# Patient Record
Sex: Female | Born: 1960 | Race: Black or African American | Hispanic: No | Marital: Married | Smoking: Never smoker
Health system: Southern US, Community
[De-identification: ages and names within clinical notes are randomized; demographics above are authoritative.]

## PROBLEM LIST (undated history)

## (undated) DIAGNOSIS — I10 Essential (primary) hypertension: Secondary | ICD-10-CM

---

## 1998-03-10 ENCOUNTER — Emergency Department (HOSPITAL_COMMUNITY): Admission: EM | Admit: 1998-03-10 | Discharge: 1998-03-10 | Payer: Self-pay

## 1998-05-05 ENCOUNTER — Emergency Department (HOSPITAL_COMMUNITY): Admission: EM | Admit: 1998-05-05 | Discharge: 1998-05-05 | Payer: Self-pay | Admitting: Internal Medicine

## 1998-05-12 ENCOUNTER — Emergency Department (HOSPITAL_COMMUNITY): Admission: EM | Admit: 1998-05-12 | Discharge: 1998-05-13 | Payer: Self-pay | Admitting: Emergency Medicine

## 1999-04-05 ENCOUNTER — Emergency Department (HOSPITAL_COMMUNITY): Admission: EM | Admit: 1999-04-05 | Discharge: 1999-04-06 | Payer: Self-pay | Admitting: *Deleted

## 2001-03-18 ENCOUNTER — Emergency Department (HOSPITAL_COMMUNITY): Admission: EM | Admit: 2001-03-18 | Discharge: 2001-03-19 | Payer: Self-pay | Admitting: Emergency Medicine

## 2008-02-27 ENCOUNTER — Emergency Department: Payer: Self-pay | Admitting: Emergency Medicine

## 2013-09-20 ENCOUNTER — Ambulatory Visit: Payer: Self-pay | Admitting: Obstetrics and Gynecology

## 2013-09-25 ENCOUNTER — Ambulatory Visit: Payer: Self-pay | Admitting: Otolaryngology

## 2013-10-08 ENCOUNTER — Ambulatory Visit: Payer: Self-pay | Admitting: Adult Health

## 2014-01-11 ENCOUNTER — Emergency Department: Payer: Self-pay | Admitting: Emergency Medicine

## 2014-01-11 LAB — CBC WITH DIFFERENTIAL/PLATELET
Basophil #: 0.1 10*3/uL (ref 0.0–0.1)
Basophil %: 0.8 %
Eosinophil #: 0 10*3/uL (ref 0.0–0.7)
Eosinophil %: 0.7 %
HCT: 39.8 % (ref 35.0–47.0)
HGB: 12.9 g/dL (ref 12.0–16.0)
Lymphocyte #: 2.2 10*3/uL (ref 1.0–3.6)
Lymphocyte %: 34.1 %
MCH: 29 pg (ref 26.0–34.0)
MCHC: 32.3 g/dL (ref 32.0–36.0)
MCV: 90 fL (ref 80–100)
Monocyte #: 0.5 x10 3/mm (ref 0.2–0.9)
Monocyte %: 7 %
Neutrophil #: 3.7 10*3/uL (ref 1.4–6.5)
Neutrophil %: 57.4 %
Platelet: 208 10*3/uL (ref 150–440)
RBC: 4.44 10*6/uL (ref 3.80–5.20)
RDW: 12.7 % (ref 11.5–14.5)
WBC: 6.5 10*3/uL (ref 3.6–11.0)

## 2014-01-11 LAB — COMPREHENSIVE METABOLIC PANEL
Albumin: 4.3 g/dL (ref 3.4–5.0)
Alkaline Phosphatase: 105 U/L
Anion Gap: 6 — ABNORMAL LOW (ref 7–16)
BUN: 11 mg/dL (ref 7–18)
Bilirubin,Total: 1 mg/dL (ref 0.2–1.0)
Calcium, Total: 9 mg/dL (ref 8.5–10.1)
Chloride: 102 mmol/L (ref 98–107)
Co2: 30 mmol/L (ref 21–32)
Creatinine: 0.74 mg/dL (ref 0.60–1.30)
EGFR (African American): >60
EGFR (Non-African Amer.): >60
Glucose: 116 mg/dL — ABNORMAL HIGH (ref 65–99)
Osmolality: 276 (ref 275–301)
Potassium: 3.2 mmol/L — ABNORMAL LOW (ref 3.5–5.1)
SGOT(AST): 39 U/L — ABNORMAL HIGH (ref 15–37)
SGPT (ALT): 21 U/L (ref 12–78)
Sodium: 138 mmol/L (ref 136–145)
Total Protein: 8.8 g/dL — ABNORMAL HIGH (ref 6.4–8.2)

## 2014-01-11 LAB — TROPONIN I: Troponin-I: <0.02 ng/mL

## 2015-04-16 ENCOUNTER — Ambulatory Visit: Payer: Medicaid Other | Attending: Oncology

## 2015-12-11 ENCOUNTER — Other Ambulatory Visit: Payer: Self-pay | Admitting: Physician Assistant

## 2015-12-11 DIAGNOSIS — Z1231 Encounter for screening mammogram for malignant neoplasm of breast: Secondary | ICD-10-CM

## 2015-12-29 ENCOUNTER — Ambulatory Visit
Admission: RE | Admit: 2015-12-29 | Discharge: 2015-12-29 | Disposition: A | Discharge status: Home / Self Care | Payer: Medicaid Other | Source: Ambulatory Visit | Attending: Oncology | Admitting: Oncology

## 2015-12-29 ENCOUNTER — Ambulatory Visit: Payer: Medicaid Other | Attending: Oncology

## 2015-12-29 ENCOUNTER — Ambulatory Visit: Payer: Medicaid Other

## 2015-12-29 VITALS — BP 117/70 | HR 80 | Temp 97.0°F | Resp 20 | Ht 64.17 in | Wt 124.8 lb

## 2015-12-29 DIAGNOSIS — Z0000 Encounter for general adult medical examination without abnormal findings: Secondary | ICD-10-CM

## 2015-12-29 NOTE — Progress Notes (Signed)
Subjective:     Patient ID: Kirsten Powell, female   DOB: September 24, 1960, 55 y.o.   MRN: [id number]  HPI   Review of Systems     Objective:   Physical Exam  Pulmonary/Chest: Right breast exhibits no inverted nipple, no mass, no nipple discharge, no skin change and no tenderness. Left breast exhibits no inverted nipple, no mass, no nipple discharge, no skin change and no tenderness. Breasts are symmetrical.       Assessment:     55 year old patient [facility]  Patient screened, and meets [facility] eligibility.  Patient does not have insurance, Medicare or Medicaid.  Handout given on Affordable Care Act.  Instructed patient on breast self-exam using teach back method,  CBE unremarkable.  No mass or lump palpated.  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     Plan:     Sent for bilateral screening mammogram.

## 2016-01-07 ENCOUNTER — Ambulatory Visit: Payer: Medicaid Other

## 2016-01-15 NOTE — Progress Notes (Signed)
Letter mailed from [facility] to notify of normal mammogram results.  Patient to return in one year for annual screening.  Copy to [facility]. 

## 2017-06-23 ENCOUNTER — Telehealth: Payer: Self-pay | Admitting: Pharmacy Technician

## 2017-06-23 NOTE — Telephone Encounter (Signed)
Patient failed to provide current poi.  No additional medication assistance will be provided by [facility] without the required proof of income documentation.  Patient notified by letter.  Nasier Thumm J. Inman Fettig Care Manager [facility] 

## 2017-09-07 ENCOUNTER — Telehealth: Payer: Self-pay | Admitting: Pharmacy Technician

## 2017-09-07 NOTE — Telephone Encounter (Signed)
Patient failed to provide current poi.  No additional medication assistance will be provided by [facility] without the required proof of income documentation.  Patient notified by letter.  Cayleb Jarnigan J. Maryrose Colvin Care Manager [facility] 

## 2018-06-25 ENCOUNTER — Emergency Department
Admission: EM | Admit: 2018-06-25 | Discharge: 2018-06-25 | Disposition: A | Discharge status: Home / Self Care | Payer: BLUE CROSS/BLUE SHIELD | Attending: Emergency Medicine | Admitting: Emergency Medicine

## 2018-06-25 ENCOUNTER — Other Ambulatory Visit: Payer: Self-pay

## 2018-06-25 ENCOUNTER — Emergency Department: Payer: BLUE CROSS/BLUE SHIELD

## 2018-06-25 DIAGNOSIS — S63502A Unspecified sprain of left wrist, initial encounter: Secondary | ICD-10-CM | POA: Diagnosis not present

## 2018-06-25 DIAGNOSIS — S6992XA Unspecified injury of left wrist, hand and finger(s), initial encounter: Secondary | ICD-10-CM | POA: Diagnosis present

## 2018-06-25 DIAGNOSIS — Y9389 Activity, other specified: Secondary | ICD-10-CM | POA: Diagnosis not present

## 2018-06-25 DIAGNOSIS — W11XXXA Fall on and from ladder, initial encounter: Secondary | ICD-10-CM | POA: Diagnosis not present

## 2018-06-25 DIAGNOSIS — I10 Essential (primary) hypertension: Secondary | ICD-10-CM | POA: Insufficient documentation

## 2018-06-25 DIAGNOSIS — Y998 Other external cause status: Secondary | ICD-10-CM | POA: Insufficient documentation

## 2018-06-25 DIAGNOSIS — Y9289 Other specified places as the place of occurrence of the external cause: Secondary | ICD-10-CM | POA: Diagnosis not present

## 2018-06-25 DIAGNOSIS — W19XXXA Unspecified fall, initial encounter: Secondary | ICD-10-CM

## 2018-06-25 HISTORY — DX: Essential (primary) hypertension: I10

## 2018-06-25 NOTE — ED Triage Notes (Signed)
Pt states she was putting some shoes up in the top of her closet and slipped and fell off the 2step ladder landing and injuring her left wrist and hand.

## 2018-06-25 NOTE — ED Notes (Signed)
Pt fell off a ladder  About 3 steps  Today  C/o pain l  Wrist  And hand  Pt  Has pain  Grip  Is  Weak l  Arm

## 2018-06-25 NOTE — ED Provider Notes (Signed)
[facility] Emergency Department Provider Note  ____________________________________________  Time seen: Approximately 7:00 PM  I have reviewed the triage vital signs and the nursing notes.   HISTORY  Chief Complaint Fall and Arm Injury    HPI Kirsten Powell is a 57 y.o. female presents to the emergency department with left hand and left wrist pain that started today after patient fell on an outstretched hand.  Patient reports that she was attempting to straighten boxes of shoes when she lost her balance.  No elbow pain or left shoulder pain.  Patient denies subjective weakness.  No numbness or tingling in the left upper extremity.  No neck pain.  Patient reports that she did not hit her head during fall.  No alleviating measures have been attempted.   Past Medical History:  Diagnosis Date  . Hypertension     There are no active problems to display for this patient.   History reviewed. No pertinent surgical history.  Prior to Admission medications   Not on File    Allergies Epinephrine  Family History  Problem Relation Age of Onset  . Breast cancer Neg Hx     Social History Social History   Tobacco Use  . Smoking status: Never Smoker  . Smokeless tobacco: Never Used  Substance Use Topics  . Alcohol use: Yes  . Drug use: Not Currently     Review of Systems  Constitutional: No fever/chills Eyes: No visual changes. No discharge ENT: No upper respiratory complaints. Cardiovascular: no chest pain. Respiratory: no cough. No SOB. Gastrointestinal: No abdominal pain.  No nausea, no vomiting.  No diarrhea.  No constipation. Genitourinary: Negative for dysuria. No hematuria Musculoskeletal: Patient has left wrist and left hand pain. Skin: Negative for rash, abrasions, lacerations, ecchymosis. Neurological: Negative for headaches, focal weakness or numbness.   ____________________________________________   PHYSICAL EXAM:  VITAL  SIGNS: ED Triage Vitals  Enc Vitals Group     BP 06/25/18 1711 130/83     Pulse Rate 06/25/18 1711 93     Resp 06/25/18 1711 14     Temp 06/25/18 1711 98.4 F (36.9 C)     Temp Source 06/25/18 1711 Oral     SpO2 06/25/18 1711 100 %     Weight 06/25/18 1711 125 lb (56.7 kg)     Height 06/25/18 1711 5\' 4"  (1.626 m)     Head Circumference --      Peak Flow --      Pain Score 06/25/18 1719 5     Pain Loc --      Pain Edu? --      Excl. in GC? --      Constitutional: Alert and oriented. Well appearing and in no acute distress. Eyes: Conjunctivae are normal. PERRL. EOMI. Head: Atraumatic. ENT:      Ears: TMs are pearly.      Nose: No congestion/rhinnorhea.      Mouth/Throat: Mucous membranes are moist.  Neck: No stridor.  No cervical spine tenderness to palpation. Cardiovascular: Normal rate, regular rhythm. Normal S1 and S2.  Good peripheral circulation. Respiratory: Normal respiratory effort without tachypnea or retractions. Lungs CTAB. Good air entry to the bases with no decreased or absent breath sounds. Gastrointestinal: Bowel sounds 4 quadrants. Soft and nontender to palpation. No guarding or rigidity. No palpable masses. No distention. No CVA tenderness. Musculoskeletal: Patient performs full range of motion at the left wrist and left elbow.  She is able to perform supination and pronation  without difficulty.  Mild tenderness is elicited with palpation of the anatomical snuffbox, left.  Palpable radial pulse bilaterally and symmetrically. Neurologic:  Normal speech and language. No gross focal neurologic deficits are appreciated.  Skin:  Skin is warm, dry and intact. No rash noted. Psychiatric: Mood and affect are normal. Speech and behavior are normal. Patient exhibits appropriate insight and judgement.   ____________________________________________   LABS (all labs ordered are listed, but only abnormal results are displayed)  Labs Reviewed - No data to  display ____________________________________________  EKG   ____________________________________________  RADIOLOGY I personally viewed and evaluated these images as part of my medical decision making, as well as reviewing the written report by the radiologist.  Dg Wrist Complete Left  Result Date: 06/25/2018 CLINICAL DATA:  Pain secondary to a fall from a ladder. EXAM: LEFT WRIST - COMPLETE 3+ VIEW COMPARISON:  None. FINDINGS: There is no evidence of fracture or dislocation. There is no evidence of arthropathy or other focal bone abnormality. Soft tissues are unremarkable. IMPRESSION: Negative. Electronically Signed   By: Francene Boyers M.D.   On: 06/25/2018 18:13   Dg Hand Complete Left  Result Date: 06/25/2018 CLINICAL DATA:  Pain secondary to a fall from a ladder. EXAM: LEFT HAND - COMPLETE 3+ VIEW COMPARISON:  None. FINDINGS: There is no evidence of fracture or dislocation. There is no evidence of significant arthropathy or other focal bone abnormality. Soft tissues are unremarkable. IMPRESSION: Negative. Electronically Signed   By: Francene Boyers M.D.   On: 06/25/2018 18:12    ____________________________________________    PROCEDURES  Procedure(s) performed:    Procedures    Medications - No data to display   ____________________________________________   INITIAL IMPRESSION / ASSESSMENT AND PLAN / ED COURSE  Pertinent labs & imaging results that were available during my care of the patient were reviewed by me and considered in my medical decision making (see chart for details).  Review of the [place] CSRS was performed in accordance of the [facility] prior to dispensing any controlled drugs.      Assessment and plan Left wrist sprain Patient presents to the emergency department with left wrist and left ear pain after falling on an outstretched left hand while trying to straighten shoes.  No acute bony abnormality was visualized on x-ray examination of the left wrist and  left hand.  Patient was placed in a Velcro wrist splint and Tylenol was recommended for pain as patient reports that her cardiologist recommends against anti-inflammatories.  Patient was advised to follow-up with orthopedics as needed.  All patient questions were answered.     ____________________________________________  FINAL CLINICAL IMPRESSION(S) / ED DIAGNOSES  Final diagnoses:  Fall, initial encounter      NEW MEDICATIONS STARTED DURING THIS VISIT:  ED Discharge Orders    None          This chart was dictated using voice recognition software/Dragon. Despite best efforts to proofread, errors can occur which can change the meaning. Any change was purely unintentional.    Orvil Feil, PA-C 06/25/18 2029    Jeanmarie Plant, MD 06/25/18 2111

## 2018-06-25 NOTE — ED Notes (Signed)
velcro cock up  Splint applied to jacob  l arm

## 2018-08-07 ENCOUNTER — Other Ambulatory Visit: Payer: Self-pay | Admitting: Family Medicine

## 2018-08-07 DIAGNOSIS — Z0000 Encounter for general adult medical examination without abnormal findings: Secondary | ICD-10-CM

## 2019-11-29 ENCOUNTER — Other Ambulatory Visit: Payer: Self-pay | Admitting: Family Medicine

## 2019-11-29 DIAGNOSIS — Z1231 Encounter for screening mammogram for malignant neoplasm of breast: Secondary | ICD-10-CM

## 2020-01-09 ENCOUNTER — Ambulatory Visit
Admission: RE | Admit: 2020-01-09 | Discharge: 2020-01-09 | Disposition: A | Discharge status: Home / Self Care | Payer: BC Managed Care – PPO | Source: Ambulatory Visit | Attending: Family Medicine | Admitting: Family Medicine

## 2020-01-09 DIAGNOSIS — Z1231 Encounter for screening mammogram for malignant neoplasm of breast: Secondary | ICD-10-CM | POA: Insufficient documentation

## 2020-02-07 ENCOUNTER — Other Ambulatory Visit: Payer: Self-pay | Admitting: Family Medicine

## 2020-02-07 ENCOUNTER — Ambulatory Visit
Admission: RE | Admit: 2020-02-07 | Discharge: 2020-02-07 | Disposition: A | Discharge status: Home / Self Care | Payer: BC Managed Care – PPO | Attending: Family Medicine | Admitting: Family Medicine

## 2020-02-07 ENCOUNTER — Ambulatory Visit
Admission: RE | Admit: 2020-02-07 | Discharge: 2020-02-07 | Disposition: A | Discharge status: Home / Self Care | Payer: BC Managed Care – PPO | Source: Ambulatory Visit | Attending: Family Medicine | Admitting: Family Medicine

## 2020-02-07 DIAGNOSIS — M545 Low back pain, unspecified: Secondary | ICD-10-CM

## 2020-02-07 DIAGNOSIS — M25551 Pain in right hip: Secondary | ICD-10-CM | POA: Diagnosis present

## 2020-02-09 ENCOUNTER — Ambulatory Visit: Payer: BC Managed Care – PPO

## 2020-06-07 ENCOUNTER — Emergency Department
Admission: EM | Admit: 2020-06-07 | Discharge: 2020-06-07 | Disposition: A | Discharge status: Home / Self Care | Payer: BC Managed Care – PPO | Attending: Emergency Medicine | Admitting: Emergency Medicine

## 2020-06-07 ENCOUNTER — Other Ambulatory Visit: Payer: Self-pay

## 2020-06-07 DIAGNOSIS — Z5321 Procedure and treatment not carried out due to patient leaving prior to being seen by health care provider: Secondary | ICD-10-CM | POA: Insufficient documentation

## 2020-06-07 DIAGNOSIS — I10 Essential (primary) hypertension: Secondary | ICD-10-CM | POA: Diagnosis not present

## 2020-06-07 LAB — COMPREHENSIVE METABOLIC PANEL
ALT: 17 U/L (ref 0–44)
AST: 27 U/L (ref 15–41)
Albumin: 4 g/dL (ref 3.5–5.0)
Alkaline Phosphatase: 67 U/L (ref 38–126)
Anion gap: 9 (ref 5–15)
BUN: 16 mg/dL (ref 6–20)
CO2: 29 mmol/L (ref 22–32)
Calcium: 9 mg/dL (ref 8.9–10.3)
Chloride: 103 mmol/L (ref 98–111)
Creatinine, Ser: 0.88 mg/dL (ref 0.44–1.00)
GFR calc Af Amer: >60 mL/min (ref >60)
GFR calc non Af Amer: >60 mL/min (ref >60)
Glucose, Bld: 118 mg/dL — ABNORMAL HIGH (ref 70–99)
Potassium: 3 mmol/L — ABNORMAL LOW (ref 3.5–5.1)
Sodium: 141 mmol/L (ref 135–145)
Total Bilirubin: 1.2 mg/dL (ref 0.3–1.2)
Total Protein: 7.6 g/dL (ref 6.5–8.1)

## 2020-06-07 LAB — CBC
HCT: 35.6 % — ABNORMAL LOW (ref 36.0–46.0)
Hemoglobin: 11.8 g/dL — ABNORMAL LOW (ref 12.0–15.0)
MCH: 29.7 pg (ref 26.0–34.0)
MCHC: 33.1 g/dL (ref 30.0–36.0)
MCV: 89.7 fL (ref 80.0–100.0)
Platelets: 211 10*3/uL (ref 150–400)
RBC: 3.97 MIL/uL (ref 3.87–5.11)
RDW: 11.8 % (ref 11.5–15.5)
WBC: 5.5 10*3/uL (ref 4.0–10.5)
nRBC: 0 % (ref 0.0–0.2)

## 2020-06-07 NOTE — ED Triage Notes (Signed)
Patient coming [facility] from home for hypertension (145/104).
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
## 2022-02-28 ENCOUNTER — Emergency Department
Admission: EM | Admit: 2022-02-28 | Discharge: 2022-02-28 | Disposition: A | Discharge status: Home / Self Care | Payer: BC Managed Care – PPO | Attending: Emergency Medicine | Admitting: Emergency Medicine

## 2022-02-28 ENCOUNTER — Emergency Department: Payer: BC Managed Care – PPO

## 2022-02-28 ENCOUNTER — Other Ambulatory Visit: Payer: Self-pay

## 2022-02-28 DIAGNOSIS — R0600 Dyspnea, unspecified: Secondary | ICD-10-CM | POA: Insufficient documentation

## 2022-02-28 DIAGNOSIS — E876 Hypokalemia: Secondary | ICD-10-CM | POA: Diagnosis not present

## 2022-02-28 DIAGNOSIS — F419 Anxiety disorder, unspecified: Secondary | ICD-10-CM | POA: Insufficient documentation

## 2022-02-28 DIAGNOSIS — R0789 Other chest pain: Secondary | ICD-10-CM | POA: Insufficient documentation

## 2022-02-28 DIAGNOSIS — R002 Palpitations: Secondary | ICD-10-CM | POA: Diagnosis present

## 2022-02-28 LAB — CBC WITH DIFFERENTIAL/PLATELET
Abs Immature Granulocytes: 0 10*3/uL (ref 0.00–0.07)
Basophils Absolute: 0 10*3/uL (ref 0.0–0.1)
Basophils Relative: 1 %
Eosinophils Absolute: 0.1 10*3/uL (ref 0.0–0.5)
Eosinophils Relative: 4 %
HCT: 37.4 % (ref 36.0–46.0)
Hemoglobin: 12.4 g/dL (ref 12.0–15.0)
Immature Granulocytes: 0 %
Lymphocytes Relative: 44 %
Lymphs Abs: 1.4 10*3/uL (ref 0.7–4.0)
MCH: 29.4 pg (ref 26.0–34.0)
MCHC: 33.2 g/dL (ref 30.0–36.0)
MCV: 88.6 fL (ref 80.0–100.0)
Monocytes Absolute: 0.4 10*3/uL (ref 0.1–1.0)
Monocytes Relative: 11 %
Neutro Abs: 1.3 10*3/uL — ABNORMAL LOW (ref 1.7–7.7)
Neutrophils Relative %: 40 %
Platelets: 218 10*3/uL (ref 150–400)
RBC: 4.22 MIL/uL (ref 3.87–5.11)
RDW: 11.6 % (ref 11.5–15.5)
WBC: 3.2 10*3/uL — ABNORMAL LOW (ref 4.0–10.5)
nRBC: 0 % (ref 0.0–0.2)

## 2022-02-28 LAB — BASIC METABOLIC PANEL
Anion gap: 10 (ref 5–15)
BUN: 14 mg/dL (ref 8–23)
CO2: 26 mmol/L (ref 22–32)
Calcium: 9.3 mg/dL (ref 8.9–10.3)
Chloride: 103 mmol/L (ref 98–111)
Creatinine, Ser: 1 mg/dL (ref 0.44–1.00)
GFR, Estimated: >60 mL/min (ref >60)
Glucose, Bld: 118 mg/dL — ABNORMAL HIGH (ref 70–99)
Potassium: 2.9 mmol/L — ABNORMAL LOW (ref 3.5–5.1)
Sodium: 139 mmol/L (ref 135–145)

## 2022-02-28 LAB — TROPONIN I (HIGH SENSITIVITY): Troponin I (High Sensitivity): 3 ng/L (ref <18)

## 2022-02-28 MED ORDER — POTASSIUM CHLORIDE 10 MEQ/100ML IV SOLN
10.0000 meq | Freq: Once | INTRAVENOUS | Status: AC
  Administered 2022-02-28: 10 meq via INTRAVENOUS
  Filled 2022-02-28: qty 100

## 2022-02-28 MED ORDER — POTASSIUM CHLORIDE CRYS ER 20 MEQ PO TBCR
40.0000 meq | EXTENDED_RELEASE_TABLET | Freq: Once | ORAL | Status: AC
  Administered 2022-02-28: 40 meq via ORAL
  Filled 2022-02-28: qty 2

## 2022-02-28 MED ORDER — SODIUM CHLORIDE 0.9 % IV SOLN: Freq: Once | INTRAVENOUS | Status: AC
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
## 2022-02-28 NOTE — ED Notes (Signed)
Sw resources given with dc instructions, pt states that they will be helpful states that she also has her own money and will get new lodging.
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
## 2022-02-28 NOTE — Discharge Instructions (Signed)
Please reach out to this resource for support.   [facility]: [phone]      At the [facility], people can come to one location to access a wide range of supportive resources, such as talking with a victim advocate, getting assistance with filing a restraining order, planning for their safety, talking to a Hydrographic surveyor, meeting with a professional to discuss civil and criminal legal issues, and gaining information on how to access shelter and other community resources.     AFTERHOURS  Immediate assistance is available 24 hours 7 days a week:     Family Abuse Services Crisis Line  [phone]     National Domestic Abuse Hotline  (985)535-3225         Shelters- Below are local DV shelters   [facility] [address] [place] [phone] DV Shelter     [facility] [place] [phone] DV Shelter     [facility]  [facility] [address] [place] [phone] DV Shelter

## 2022-03-01 ENCOUNTER — Emergency Department: Payer: BC Managed Care – PPO

## 2022-03-01 ENCOUNTER — Emergency Department
Admission: EM | Admit: 2022-03-01 | Discharge: 2022-03-01 | Disposition: A | Discharge status: Home / Self Care | Payer: BC Managed Care – PPO | Attending: Emergency Medicine | Admitting: Emergency Medicine

## 2022-03-01 ENCOUNTER — Other Ambulatory Visit: Payer: Self-pay

## 2022-03-01 ENCOUNTER — Ambulatory Visit: Payer: Self-pay | Admitting: *Deleted

## 2022-03-01 ENCOUNTER — Encounter: Payer: Self-pay | Admitting: Emergency Medicine

## 2022-03-01 DIAGNOSIS — R5383 Other fatigue: Secondary | ICD-10-CM | POA: Insufficient documentation

## 2022-03-01 DIAGNOSIS — R002 Palpitations: Secondary | ICD-10-CM | POA: Diagnosis not present

## 2022-03-01 DIAGNOSIS — R531 Weakness: Secondary | ICD-10-CM | POA: Diagnosis present

## 2022-03-01 DIAGNOSIS — R0602 Shortness of breath: Secondary | ICD-10-CM | POA: Insufficient documentation

## 2022-03-01 LAB — CBC
HCT: 35.7 % — ABNORMAL LOW (ref 36.0–46.0)
Hemoglobin: 11.5 g/dL — ABNORMAL LOW (ref 12.0–15.0)
MCH: 28.8 pg (ref 26.0–34.0)
MCHC: 32.2 g/dL (ref 30.0–36.0)
MCV: 89.5 fL (ref 80.0–100.0)
Platelets: 210 10*3/uL (ref 150–400)
RBC: 3.99 MIL/uL (ref 3.87–5.11)
RDW: 11.7 % (ref 11.5–15.5)
WBC: 3.2 10*3/uL — ABNORMAL LOW (ref 4.0–10.5)
nRBC: 0 % (ref 0.0–0.2)

## 2022-03-01 LAB — COMPREHENSIVE METABOLIC PANEL
ALT: 14 U/L (ref 0–44)
AST: 27 U/L (ref 15–41)
Albumin: 3.9 g/dL (ref 3.5–5.0)
Alkaline Phosphatase: 65 U/L (ref 38–126)
Anion gap: 6 (ref 5–15)
BUN: 15 mg/dL (ref 8–23)
CO2: 27 mmol/L (ref 22–32)
Calcium: 9 mg/dL (ref 8.9–10.3)
Chloride: 104 mmol/L (ref 98–111)
Creatinine, Ser: 0.8 mg/dL (ref 0.44–1.00)
GFR, Estimated: >60 mL/min (ref >60)
Glucose, Bld: 102 mg/dL — ABNORMAL HIGH (ref 70–99)
Potassium: 3.5 mmol/L (ref 3.5–5.1)
Sodium: 137 mmol/L (ref 135–145)
Total Bilirubin: 2 mg/dL — ABNORMAL HIGH (ref 0.3–1.2)
Total Protein: 7.4 g/dL (ref 6.5–8.1)

## 2022-03-01 LAB — URINALYSIS, ROUTINE W REFLEX MICROSCOPIC
Bilirubin Urine: NEGATIVE
Glucose, UA: NEGATIVE mg/dL
Hgb urine dipstick: NEGATIVE
Ketones, ur: NEGATIVE mg/dL
Leukocytes,Ua: NEGATIVE
Nitrite: NEGATIVE
Protein, ur: NEGATIVE mg/dL
Specific Gravity, Urine: 1.009 (ref 1.005–1.030)
pH: 6 (ref 5.0–8.0)

## 2022-03-01 LAB — TROPONIN I (HIGH SENSITIVITY): Troponin I (High Sensitivity): 4 ng/L (ref <18)

## 2022-03-01 MED ORDER — IOHEXOL 350 MG/ML SOLN
75.0000 mL | Freq: Once | INTRAVENOUS | Status: AC | PRN
  Administered 2022-03-01: 75 mL via INTRAVENOUS

## 2022-03-01 NOTE — ED Triage Notes (Signed)
Pt here for generalized weakness and shob for over a month. Was seen here yesterday for the same and dx with hypokalemia. Pt here for persistent symptoms.

## 2022-03-01 NOTE — ED Provider Notes (Signed)
[facility] Provider Note    Event Date/Time   First MD Initiated Contact with Patient 03/01/22 1111     (approximate)   History   Weakness   HPI  Jamilex Bohnsack is a 61 y.o. female with no significant past medical history presents with complaints of generalized weakness.  She reports she feels winded with exertion which is atypical for her.  She was seen yesterday and had a reassuring work-up but reprinted today for further evaluation.  No chest pain reported.  No pleurisy.  No history of PE.  No fevers chills.     Physical Exam   Triage Vital Signs: ED Triage Vitals  Enc Vitals Group     BP 03/01/22 1019 (!) 130/93     Pulse Rate 03/01/22 1019 (!) 118     Resp 03/01/22 1019 20     Temp 03/01/22 1019 98.8 F (37.1 C)     Temp Source 03/01/22 1019 Oral     SpO2 03/01/22 1019 100 %     Weight 03/01/22 1020 52 kg (114 lb 10.2 oz)     Height 03/01/22 1020 1.626 m (5\' 4" )     Head Circumference --      Peak Flow --      Pain Score 03/01/22 1019 0     Pain Loc --      Pain Edu? --      Excl. in GC? --     Most recent vital signs: Vitals:   03/01/22 1019  BP: (!) 130/93  Pulse: (!) 118  Resp: 20  Temp: 98.8 F (37.1 C)  SpO2: 100%     General: Awake, no distress.  CV:  Good peripheral perfusion.  Tachycardia Resp:  Normal effort.  CTA bilaterally Abd:  No distention.  Other:     ED Results / Procedures / Treatments   Labs (all labs ordered are listed, but only abnormal results are displayed) Labs Reviewed  CBC - Abnormal; Notable for the following components:      Result Value   WBC 3.2 (*)    Hemoglobin 11.5 (*)    HCT 35.7 (*)    All other components within normal limits  COMPREHENSIVE METABOLIC PANEL - Abnormal; Notable for the following components:   Glucose, Bld 102 (*)    Total Bilirubin 2.0 (*)    All other components within normal limits  URINALYSIS, ROUTINE W REFLEX MICROSCOPIC - Abnormal; Notable for the  following components:   Color, Urine YELLOW (*)    APPearance CLEAR (*)    All other components within normal limits  TROPONIN I (HIGH SENSITIVITY)     EKG  ED ECG REPORT I, 03/03/22, the attending physician, personally viewed and interpreted this ECG.  Date: 03/01/2022  Rhythm: normal sinus rhythm QRS Axis: normal Intervals: normal ST/T Wave abnormalities: normal Narrative Interpretation: no evidence of acute ischemia    RADIOLOGY CT angiography viewed by me, no evidence of PE    PROCEDURES:  Critical Care performed:   Procedures   MEDICATIONS ORDERED IN ED: Medications  iohexol (OMNIPAQUE) 350 MG/ML injection 75 mL (75 mLs Intravenous Contrast Given 03/01/22 1215)     IMPRESSION / MDM / ASSESSMENT AND PLAN / ED COURSE  I reviewed the triage vital signs and the nursing notes. Patient's presentation is most consistent with acute presentation with potential threat to life or bodily function.   Patient presents with fatigue, shortness of breath and occasional palpitations as detailed above  Differential includes pneumonia, pulmonary edema, PE  EKG is reassuring, high sensitive troponin is normal  CMP CBC is unremarkable  Sent for CT angiography, negative for PE pneumonia or pulmonary edema  Patient is significantly reassured by this.  Considered admission for further evaluation however she feels comfortable with discharge and outpatient follow-up as needed       FINAL CLINICAL IMPRESSION(S) / ED DIAGNOSES   Final diagnoses:  Weakness     Rx / DC Orders   ED Discharge Orders     None        Note:  This document was prepared using Dragon voice recognition software and may include unintentional dictation errors.   Jene Every, MD 03/01/22 1549

## 2022-03-01 NOTE — Telephone Encounter (Signed)
  Chief Complaint: dizziness Symptoms: feels weak and dizzy. Requesting to get a work note and receive more IVF. Reports she was cut off short yesterday in ED when receiving potassium Frequency: today  Pertinent Negatives: Patient denies chest pain difficulty breahting Disposition: [x] ED /[] Urgent Care (no appt availability in office) / [] Appointment(In office/virtual)/ []  [facility]/ [] Home Care/ [] Refused Recommended Disposition /[] [facility]/ []  Follow-up with PCP Additional Notes:   Recommended patient not to drive if dizzy and weak and call 911. Patient reports over and over she did not receive full dose of IVF yesterday and is still weak. PCP office closed. Going back to ED.     Reason for Disposition  [1] Dizziness caused by heat exposure, sudden standing, or poor fluid intake AND [2] no improvement after 2 hours of rest and fluids  Answer Assessment - Initial Assessment Questions 1. DESCRIPTION: "Describe your dizziness."     Feels weak  2. LIGHTHEADED: "Do you feel lightheaded?" (e.g., somewhat faint, woozy, weak upon standing)     Feels weak  3. VERTIGO: "Do you feel like either you or the room is spinning or tilting?" (i.e. vertigo)     No  4. SEVERITY: "How bad is it?"  "Do you feel like you are going to faint?" "Can you stand and walk?"   - MILD: Feels slightly dizzy, but walking normally.   - MODERATE: Feels unsteady when walking, but not falling; interferes with normal activities (e.g., school, work).   - SEVERE: Unable to walk without falling, or requires assistance to walk without falling; feels like passing out now.      Not bed now  5. ONSET:  "When did the dizziness begin?"     Na  6. AGGRAVATING FACTORS: "Does anything make it worse?" (e.g., standing, change in head position)     Na  7. HEART RATE: "Can you tell me your heart rate?" "How many beats in 15 seconds?"  (Note: not all patients can do this)       na 8. CAUSE: "What do you think  is causing the dizziness?"     She states she did not receive her full dose of IVF yesterday in ED 9. RECURRENT SYMPTOM: "Have you had dizziness before?" If Yes, ask: "When was the last time?" "What happened that time?"     Yes seen in ED yesterday  10. OTHER SYMPTOMS: "Do you have any other symptoms?" (e.g., fever, chest pain, vomiting, diarrhea, bleeding)       Dizziness  11. PREGNANCY: "Is there any chance you are pregnant?" "When was your last menstrual period?"       na  Protocols used: Dizziness - Lightheadedness-A-AH

## 2022-03-15 NOTE — Progress Notes (Deleted)
Cardiology Office Note:    Date:  03/15/2022  NAME:  Kirsten Powell    MRN: [id number] DOB:  13-Dec-1960   PCP:  Emogene Morgan, MD  Cardiologist:  None  Electrophysiologist:  None   Referring MD: Emogene Morgan, MD   No chief complaint on file. ***  History of Present Illness:    Kirsten Powell is a 61 y.o. female with a hx of HTN who is being seen today for the evaluation of SOB at the request of Emogene Morgan, MD.  ER Evaluation for weakness 03/01/2022. Minimal coronary calcifications noted.   Past Medical History: Past Medical History:  Diagnosis Date   Hypertension     Past Surgical History: No past surgical history on file.  Current Medications: No outpatient medications have been marked as taking for the 03/17/22 encounter (Appointment) with O'Neal, Ronnald Ramp, MD.     Allergies:    Epinephrine   Social History: Social History   Socioeconomic History   Marital status: Married    Spouse name: Not on file   Number of children: Not on file   Years of education: Not on file   Highest education level: Not on file  Occupational History   Not on file  Tobacco Use   Smoking status: Never   Smokeless tobacco: Never  Substance and Sexual Activity   Alcohol use: Yes   Drug use: Not Currently   Sexual activity: Not on file  Other Topics Concern   Not on file  Social History Narrative   Not on file   Social Determinants of Health   Financial Resource Strain: Not on file  Food Insecurity: Not on file  Transportation Needs: Not on file  Physical Activity: Not on file  Stress: Not on file  Social Connections: Not on file     Family History: The patient's ***family history is negative for Breast cancer.  ROS:   All other ROS reviewed and negative. Pertinent positives noted in the HPI.    EKGs/Labs/Other Studies Reviewed:   The following studies were personally reviewed by me today:  EKG:  EKG is *** ordered today.  The ekg ordered today  demonstrates ***, and was personally reviewed by me.   Recent Labs: 03/01/2022: ALT 14; BUN 15; Creatinine, Ser 0.80; Hemoglobin 11.5; Platelets 210; Potassium 3.5; Sodium 137   Recent Lipid Panel No results found for: "CHOL", "TRIG", "HDL", "CHOLHDL", "VLDL", "LDLCALC", "LDLDIRECT"  Physical Exam:   VS:  There were no vitals taken for this visit.   Wt Readings from Last 3 Encounters:  03/01/22 114 lb 10.2 oz (52 kg)  02/28/22 115 lb (52.2 kg)  06/07/20 125 lb (56.7 kg)    General: Well nourished, well developed, in no acute distress Head: Atraumatic, normal size  Eyes: PEERLA, EOMI  Neck: Supple, no JVD Endocrine: No thryomegaly Cardiac: Normal S1, S2; RRR; no murmurs, rubs, or gallops Lungs: Clear to auscultation bilaterally, no wheezing, rhonchi or rales  Abd: Soft, nontender, no hepatomegaly  Ext: No edema, pulses 2+ Musculoskeletal: No deformities, BUE and BLE strength normal and equal Skin: Warm and dry, no rashes   Neuro: Alert and oriented to person, place, time, and situation, CNII-XII grossly intact, no focal deficits  Psych: Normal mood and affect   ASSESSMENT:   Kirsten Powell is a 61 y.o. female who presents for the following: 1. SOB (shortness of breath) on exertion     PLAN:   1. SOB (shortness of breath)  on exertion ***   {Are you ordering a CV Procedure (e.g. stress test, cath, DCCV, TEE, etc)?   Press F2        :[id number]}  Disposition: No follow-ups on file.  Medication Adjustments/Labs and Tests Ordered: Current medicines are reviewed at length with the patient today.  Concerns regarding medicines are outlined above.  No orders of the defined types were placed in this encounter.  No orders of the defined types were placed in this encounter.   There are no Patient Instructions on file for this visit.   Time Spent with Patient: I have spent a total of *** minutes with patient reviewing hospital notes, telemetry, EKGs, labs and examining the  patient as well as establishing an assessment and plan that was discussed with the patient.  > 50% of time was spent in direct patient care.  Signed, Lenna Gilford. Flora Lipps, MD, [facility]  [facility]  [address], [address] [place], [place] [address] [phone]  03/15/2022 10:42 AM

## 2022-03-17 ENCOUNTER — Ambulatory Visit (HOSPITAL_BASED_OUTPATIENT_CLINIC_OR_DEPARTMENT_OTHER): Payer: BC Managed Care – PPO | Admitting: Cardiovascular Disease

## 2022-03-17 DIAGNOSIS — R0602 Shortness of breath: Secondary | ICD-10-CM

## 2022-08-11 ENCOUNTER — Encounter: Payer: Self-pay | Admitting: Family Medicine

## 2022-12-23 ENCOUNTER — Other Ambulatory Visit: Payer: Self-pay | Admitting: Family Medicine

## 2022-12-23 DIAGNOSIS — Z1231 Encounter for screening mammogram for malignant neoplasm of breast: Secondary | ICD-10-CM

## 2023-01-27 ENCOUNTER — Ambulatory Visit
Admission: RE | Admit: 2023-01-27 | Discharge: 2023-01-27 | Disposition: A | Discharge status: Home / Self Care | Payer: BC Managed Care – PPO | Source: Ambulatory Visit | Attending: Family Medicine | Admitting: Family Medicine

## 2023-01-27 DIAGNOSIS — Z1231 Encounter for screening mammogram for malignant neoplasm of breast: Secondary | ICD-10-CM

## 2023-05-17 ENCOUNTER — Emergency Department
Admission: EM | Admit: 2023-05-17 | Discharge: 2023-05-17 | Disposition: A | Discharge status: Home / Self Care | Payer: BC Managed Care – PPO | Attending: Emergency Medicine | Admitting: Emergency Medicine

## 2023-05-17 ENCOUNTER — Emergency Department: Payer: BC Managed Care – PPO

## 2023-05-17 ENCOUNTER — Encounter: Payer: Self-pay | Admitting: Emergency Medicine

## 2023-05-17 ENCOUNTER — Other Ambulatory Visit: Payer: Self-pay

## 2023-05-17 DIAGNOSIS — R0602 Shortness of breath: Secondary | ICD-10-CM | POA: Diagnosis present

## 2023-05-17 DIAGNOSIS — R0789 Other chest pain: Secondary | ICD-10-CM | POA: Diagnosis not present

## 2023-05-17 LAB — CBC
HCT: 36.4 % (ref 36.0–46.0)
Hemoglobin: 11.9 g/dL — ABNORMAL LOW (ref 12.0–15.0)
MCH: 29.5 pg (ref 26.0–34.0)
MCHC: 32.7 g/dL (ref 30.0–36.0)
MCV: 90.3 fL (ref 80.0–100.0)
Platelets: 201 10*3/uL (ref 150–400)
RBC: 4.03 MIL/uL (ref 3.87–5.11)
RDW: 11.9 % (ref 11.5–15.5)
WBC: 4.7 10*3/uL (ref 4.0–10.5)
nRBC: 0 % (ref 0.0–0.2)

## 2023-05-17 LAB — BASIC METABOLIC PANEL
Anion gap: 9 (ref 5–15)
BUN: 17 mg/dL (ref 8–23)
CO2: 26 mmol/L (ref 22–32)
Calcium: 8.7 mg/dL — ABNORMAL LOW (ref 8.9–10.3)
Chloride: 104 mmol/L (ref 98–111)
Creatinine, Ser: 0.9 mg/dL (ref 0.44–1.00)
GFR, Estimated: >60 mL/min (ref >60)
Glucose, Bld: 107 mg/dL — ABNORMAL HIGH (ref 70–99)
Potassium: 3.4 mmol/L — ABNORMAL LOW (ref 3.5–5.1)
Sodium: 139 mmol/L (ref 135–145)

## 2023-05-17 LAB — TROPONIN I (HIGH SENSITIVITY)
Troponin I (High Sensitivity): 4 ng/L (ref <18)
Troponin I (High Sensitivity): 3 ng/L (ref <18)

## 2023-05-17 MED ORDER — ACETAMINOPHEN 325 MG PO TABS
650.0000 mg | ORAL_TABLET | Freq: Once | ORAL | Status: AC
Start: 2023-05-17 — End: 2023-05-17
  Administered 2023-05-17: 650 mg via ORAL
  Filled 2023-05-17: qty 2

## 2023-05-17 MED ORDER — IBUPROFEN 600 MG PO TABS
600.0000 mg | ORAL_TABLET | Freq: Once | ORAL | Status: AC
  Administered 2023-05-17: 600 mg via ORAL
  Filled 2023-05-17: qty 1

## 2023-05-17 NOTE — ED Notes (Addendum)
Shoulder sling and immobilizer applied. Pt states understanding for use. D/C instructions given

## 2023-05-17 NOTE — ED Provider Notes (Signed)
[facility] Provider Note    Event Date/Time   First MD Initiated Contact with Patient 05/17/23 0510     (approximate)   History   Shortness of Breath   HPI  Kirsten Powell is a 62 y.o. female who presents to the ED for evaluation of Shortness of Breath   Patient presents to the ED for evaluation of a brief episode of chest discomfort and shortness of breath that has since resolved.  She reports 2 to 3 years of chronic left arm and shoulder pain after an injury.  She reports waking up from her sleep due to this chronic left arm pain, getting up out of bed and as she was getting up she felt a brief spell of dyspnea that resolved after seconds-minutes and has not recurred.  She drove herself.  Reports feeling better now that she has "calmed down" but wanted to get her heart checked out.   Physical Exam   Triage Vital Signs: ED Triage Vitals  Encounter Vitals Group     BP 05/17/23 0509 (!) 131/93     Systolic BP Percentile --      Diastolic BP Percentile --      Pulse Rate 05/17/23 0509 76     Resp 05/17/23 0509 18     Temp 05/17/23 0509 98 F (36.7 C)     Temp Source 05/17/23 0509 Oral     SpO2 05/17/23 0509 100 %     Weight 05/17/23 0506 112 lb (50.8 kg)     Height 05/17/23 0506 5\' 4"  (1.626 m)     Head Circumference --      Peak Flow --      Pain Score 05/17/23 0505 7     Pain Loc --      Pain Education --      Exclude from Growth Chart --     Most recent vital signs: Vitals:   05/17/23 0509  BP: (!) 131/93  Pulse: 76  Resp: 18  Temp: 98 F (36.7 C)  SpO2: 100%    General: Awake, no distress.  CV:  Good peripheral perfusion.  RRR without appreciable murmur Resp:  Normal effort.  Abd:  No distention.  MSK:  No deformity noted.  No signs of trauma, swelling, rashes or any derangements to her left arm at areas of chronic pain.  Fully ranging arm and shoulders Neuro:  No focal deficits appreciated. Other:     ED Results /  Procedures / Treatments   Labs (all labs ordered are listed, but only abnormal results are displayed) Labs Reviewed  CBC - Abnormal; Notable for the following components:      Result Value   Hemoglobin 11.9 (*)    All other components within normal limits  BASIC METABOLIC PANEL - Abnormal; Notable for the following components:   Potassium 3.4 (*)    Glucose, Bld 107 (*)    Calcium 8.7 (*)    All other components within normal limits  TROPONIN I (HIGH SENSITIVITY)    EKG Sinus rhythm with a rate of 76 bpm.  Normal axis and intervals.  No clear signs of acute ischemia.  RADIOLOGY CXR interpreted by me without evidence of acute cardiopulmonary pathology.  Official radiology report(s): DG Chest Portable 1 View  Result Date: 05/17/2023 CLINICAL DATA:  62 year old female with history of shortness of breath. EXAM: PORTABLE CHEST 1 VIEW COMPARISON:  Chest x-ray 02/28/2022. FINDINGS: Lung volumes are normal. No consolidative airspace disease. No  pleural effusions. No pneumothorax. No pulmonary nodule or mass noted. Pulmonary vasculature and the cardiomediastinal silhouette are within normal limits. Atherosclerosis in the thoracic aorta. IMPRESSION: 1.  No radiographic evidence of acute cardiopulmonary disease. 2. Aortic atherosclerosis. Electronically Signed   By: Trudie Reed M.D.   On: 05/17/2023 05:27    PROCEDURES and INTERVENTIONS:  .1-3 Lead EKG Interpretation  Performed by: Delton Prairie, MD Authorized by: Delton Prairie, MD     Interpretation: normal     ECG rate:  74   ECG rate assessment: normal     Rhythm: sinus rhythm     Ectopy: none     Conduction: normal     Medications - No data to display   IMPRESSION / MDM / ASSESSMENT AND PLAN / ED COURSE  I reviewed the triage vital signs and the nursing notes.  Differential diagnosis includes, but is not limited to, ACS, PTX, PNA, muscle strain/spasm, PE, dissection, anxiety, pleural effusion  {Patient presents with  symptoms of an acute illness or injury that is potentially life-threatening.   Patient presents after a brief episode of chest discomfort and dyspnea, since resolved.  Looks well with benign workup, clear CXR, nonischemic EKG and first negative troponin.  CBC and electrolytes are reassuring.  Considering the chronicity of her symptoms a second troponin is reasonable.  We will observe her on the monitor until then and suspect she will be suitable for outpatient management, as long as this number does not rise significantly      FINAL CLINICAL IMPRESSION(S) / ED DIAGNOSES   Final diagnoses:  Shortness of breath  Other chest pain     Rx / DC Orders   ED Discharge Orders     None        Note:  This document was prepared using Dragon voice recognition software and may include unintentional dictation errors.   Delton Prairie, MD 05/17/23 [phone]

## 2023-05-17 NOTE — Discharge Instructions (Addendum)
Take acetaminophen 650 mg and ibuprofen 400 mg every 6 hours for pain.  Take with food.

## 2023-05-17 NOTE — ED Triage Notes (Signed)
Patient ambulatory to triage with steady gait, without difficulty or distress noted; pt reports last 2 days having pain to left upper arm accomp by [facility]

## 2023-05-17 NOTE — ED Notes (Signed)
Pt ambulatory to Room 18 by this EDT and EDT, Tricia. Pt changed into hospital gown and put on cardiac monitor. Pt RN notified that pt is in room and call bell is within reach.

## 2023-05-17 NOTE — ED Provider Notes (Signed)
Troponin negative.  Patient called into the room to discuss her chronic unchanged left arm pain from an injury sustained years ago.  She thinks he exacerbated it when she pulled on a bed earlier this week.  She has full active range of motion no bony tenderness neurovascular intact to the affected left upper extremity.  She is asking for an x-ray but I do not think this is advisable given very low clinical suspicion for fractures or dislocations or bony derangements emergent conditions.  Advised on pain medications, she can call orthopedist for follow-up if ongoing pain, and given a sling for comfort.   Pilar Jarvis, MD 05/17/23 [phone]

## 2023-11-01 ENCOUNTER — Emergency Department: Payer: BC Managed Care – PPO

## 2023-11-01 ENCOUNTER — Emergency Department
Admission: EM | Admit: 2023-11-01 | Discharge: 2023-11-01 | Disposition: A | Discharge status: Home / Self Care | Payer: BC Managed Care – PPO | Attending: Emergency Medicine | Admitting: Emergency Medicine

## 2023-11-01 ENCOUNTER — Other Ambulatory Visit: Payer: Self-pay

## 2023-11-01 DIAGNOSIS — R079 Chest pain, unspecified: Secondary | ICD-10-CM | POA: Insufficient documentation

## 2023-11-01 DIAGNOSIS — I10 Essential (primary) hypertension: Secondary | ICD-10-CM | POA: Diagnosis not present

## 2023-11-01 LAB — CBC
HCT: 37.1 % (ref 36.0–46.0)
Hemoglobin: 12.4 g/dL (ref 12.0–15.0)
MCH: 30.2 pg (ref 26.0–34.0)
MCHC: 33.4 g/dL (ref 30.0–36.0)
MCV: 90.3 fL (ref 80.0–100.0)
Platelets: 201 10*3/uL (ref 150–400)
RBC: 4.11 MIL/uL (ref 3.87–5.11)
RDW: 11.5 % (ref 11.5–15.5)
WBC: 6.2 10*3/uL (ref 4.0–10.5)
nRBC: 0 % (ref 0.0–0.2)

## 2023-11-01 LAB — BASIC METABOLIC PANEL
Anion gap: 12 (ref 5–15)
BUN: 13 mg/dL (ref 8–23)
CO2: 26 mmol/L (ref 22–32)
Calcium: 8.7 mg/dL — ABNORMAL LOW (ref 8.9–10.3)
Chloride: 99 mmol/L (ref 98–111)
Creatinine, Ser: 0.87 mg/dL (ref 0.44–1.00)
GFR, Estimated: >60 mL/min (ref >60)
Glucose, Bld: 111 mg/dL — ABNORMAL HIGH (ref 70–99)
Potassium: 3 mmol/L — ABNORMAL LOW (ref 3.5–5.1)
Sodium: 137 mmol/L (ref 135–145)

## 2023-11-01 LAB — LIPASE, BLOOD: Lipase: 28 U/L (ref 11–51)

## 2023-11-01 LAB — TROPONIN I (HIGH SENSITIVITY)
Troponin I (High Sensitivity): 4 ng/L (ref <18)
Troponin I (High Sensitivity): 6 ng/L (ref <18)

## 2023-11-01 MED ORDER — POTASSIUM CHLORIDE CRYS ER 20 MEQ PO TBCR
40.0000 meq | EXTENDED_RELEASE_TABLET | Freq: Once | ORAL | Status: AC
  Administered 2023-11-01: 40 meq via ORAL
  Filled 2023-11-01: qty 2

## 2023-11-01 NOTE — ED Notes (Signed)
 Pt escorted to waiting room at this time with RN. Pt verbalizes understanding of teachings and denies any further needs. Pt ABCs intact. Pt RR even and unlabored. Pt in NAD.

## 2023-11-01 NOTE — ED Triage Notes (Signed)
 Pt reports left sided chest pain, described as heaviness, that started today around 1400 and lasted around 10 min. Denies any other symptoms. She reports the chest heaviness has went away now.

## 2023-11-01 NOTE — ED Notes (Signed)
 RN assumed care of pt at this time. Pt reporting to ED d/t L sided chest pain described as heaviness. Pt states that the heaviness only lasted about 10 minutes before going away. Pt does not endorse chest pain at this time but does seem anxious. Pt connected to monitor. Pt ABCs intact. RR even and unlabored. Pt in NAD. Bed in lowest locked position. Call bell in reach. Denies needs at this time.

## 2023-11-01 NOTE — ED Provider Triage Note (Signed)
 Emergency Medicine Provider Triage Evaluation Note  Kirsten Powell , a 63 y.o. female  was evaluated in triage.  Pt complains of cp after eating greasy food this am.  Review of Systems  Positive:  Negative:   Physical Exam  BP (!) 126/106 (BP Location: Right Arm)   Pulse 98   Temp 98.6 F (37 C) (Oral)   Resp 17   Ht 5\' 4"  (1.626 m)   Wt 49.9 kg   SpO2 98%   BMI 18.88 kg/m  Gen:   Awake, no distress   Resp:  Normal effort  MSK:   Moves extremities without difficulty  Other:    Medical Decision Making  Medically screening exam initiated at 3:12 PM.  Appropriate orders placed.  Samatha Anspach was informed that the remainder of the evaluation will be completed by another provider, this initial triage assessment does not replace that evaluation, and the importance of remaining in the ED until their evaluation is complete.     Faythe Ghee, PA-C 11/01/23 1512

## 2023-11-01 NOTE — ED Provider Notes (Signed)
 [facility] Provider Note    Event Date/Time   First MD Initiated Contact with Patient 11/01/23 1854     (approximate)   History   Chest Pain   HPI  Kirsten Powell is a 63 y.o. female with history of hypertension presenting with left-sided chest pain that started around 2:00 today.  Lasted for 10 minutes and self resolved.  Denies shortness of breath, cough, infectious symptoms, nausea, vomiting, diarrhea, abdominal pain.  States that she has been under a lot of stress with her job lately, also has been eating more greasy foods.  Did not take any medications for the pain.  Denies prior history of MI.  No prior history of blood clots, no history of congestive heart failure.  On independent review, she was last seen by her primary care doctor in 2021 for chest wall pain, she was discharged with naproxen as needed.     Physical Exam   Triage Vital Signs: ED Triage Vitals  Encounter Vitals Group     BP 11/01/23 1507 (!) 126/106     Systolic BP Percentile --      Diastolic BP Percentile --      Pulse Rate 11/01/23 1507 98     Resp 11/01/23 1507 17     Temp 11/01/23 1507 98.6 F (37 C)     Temp Source 11/01/23 1507 Oral     SpO2 11/01/23 1507 98 %     Weight 11/01/23 1508 110 lb (49.9 kg)     Height 11/01/23 1508 5\' 4"  (1.626 m)     Head Circumference --      Peak Flow --      Pain Score 11/01/23 1507 0     Pain Loc --      Pain Education --      Exclude from Growth Chart --     Most recent vital signs: Vitals:   11/01/23 1507 11/01/23 1930  BP: (!) 126/106 (!) 141/81  Pulse: 98 66  Resp: 17 17  Temp: 98.6 F (37 C)   SpO2: 98% 98%     General: Awake, no distress.  CV:  Good peripheral perfusion.  Resp:  Normal effort.  Clear Abd:  No distention.  Soft nontender Other:  No lower extremity edema, no unilateral calf swelling or tenderness   ED Results / Procedures / Treatments   Labs (all labs ordered are listed, but only  abnormal results are displayed) Labs Reviewed  BASIC METABOLIC PANEL - Abnormal; Notable for the following components:      Result Value   Potassium 3.0 (*)    Glucose, Bld 111 (*)    Calcium 8.7 (*)    All other components within normal limits  CBC  LIPASE, BLOOD  TROPONIN I (HIGH SENSITIVITY)  TROPONIN I (HIGH SENSITIVITY)     EKG  Sinus rhythm with sinus arrhythmia, rate 80, normal QRS, normal QTc, T wave flattening in aVL, no ischemic ST elevation, T wave flattening is new compared to prior   RADIOLOGY Chest x-ray on my interpretation without focal consolidation.   PROCEDURES:  Critical Care performed: No  Procedures   MEDICATIONS ORDERED IN ED: Medications  potassium chloride SA (KLOR-CON M) CR tablet 40 mEq (40 mEq Oral Given 11/01/23 1943)     IMPRESSION / MDM / ASSESSMENT AND PLAN / ED COURSE  I reviewed the triage vital signs and the nursing notes.  Differential diagnosis includes, but is not limited to, ACS, angina, anxiety, stress, GERD, considered CHF but she has no evidence of volume overload, not short of breath or hypoxic.  Considered PE but she has no other risk factors for it, also not hypoxic.  Labs were obtained out of triage including x-ray as well as EKG.  Given that the pain started at 2, will get a second troponin.  Patient's presentation is most consistent with acute presentation with potential threat to life or bodily function.  Independent review of labs and imaging are elsewhere in the chart.  Will discharge patient with strict return precautions.  Considered but no indication for inpatient admission at this time, she is safe for outpatient management.  Clinical Course as of 11/01/23 2024  Tue Nov 01, 2023  1855 DG Chest 2 View IMPRESSION: No acute cardiopulmonary findings.   [TT]  1913 Independent review of labs, her potassium is low, will replete, creatinine is not elevated, initial Trope is negative,  lipase is normal, no leukocytosis. [TT]  2023 Troponin I (High Sensitivity) Troponin x 2 is negative. [TT]    Clinical Course User Index [TT] Jodie Echevaria, Franchot Erichsen, MD     FINAL CLINICAL IMPRESSION(S) / ED DIAGNOSES   Final diagnoses:  Chest pain, unspecified type     Rx / DC Orders   ED Discharge Orders     None        Note:  This document was prepared using Dragon voice recognition software and may include unintentional dictation errors.    Claybon Jabs, MD 11/01/23 2025

## 2023-11-24 ENCOUNTER — Ambulatory Visit: Admitting: Occupational Therapy

## 2023-12-06 ENCOUNTER — Other Ambulatory Visit: Payer: Self-pay | Admitting: Family Medicine

## 2023-12-06 DIAGNOSIS — Z1231 Encounter for screening mammogram for malignant neoplasm of breast: Secondary | ICD-10-CM

## 2023-12-09 ENCOUNTER — Ambulatory Visit: Admitting: Occupational Therapy

## 2024-01-25 ENCOUNTER — Ambulatory Visit: Payer: BC Managed Care – PPO | Attending: Cardiology | Admitting: Cardiology

## 2024-03-30 ENCOUNTER — Ambulatory Visit
Admission: RE | Admit: 2024-03-30 | Discharge: 2024-03-30 | Disposition: A | Discharge status: Home / Self Care | Source: Ambulatory Visit | Attending: Family Medicine | Admitting: Family Medicine

## 2024-03-30 DIAGNOSIS — Z1231 Encounter for screening mammogram for malignant neoplasm of breast: Secondary | ICD-10-CM | POA: Diagnosis present

## 2024-06-01 NOTE — Progress Notes (Signed)
 Endocrinology Note: Osteoporosis   Assessment and Plan: #Osteoporosis -Treatment history:  -none -Fracture history:  -none -Risk factors:  -Female, age, postmenopausal, primary ovarian insufficiency, weight under 127 pounds -She may have some upcoming plans for dental procedures.  -Concern for secondary cause given severity of osteoporosis at such a young age.  Most likely explained by primary ovarian insufficiency starting around age 63 and small stature. -Would likely do best with aggressive treatment given severe osteoporosis: Roughly 2-year course of Prolia followed by 2 to 4 years of Reclast, which would hopefully give robust improvement in bone mineral density given young age.  Patient is anxious about treatments and wishes to think about it before making a decision to start treatment. Plan: -She will let me know if/when she is ready to start treatment, which I strongly encouraged.  -Rule out secondary causes given severity of osteoporosis at such a young age -Check 25-vitamin D, TSH -Check 24h urine Ca/Cr, Ca, PTH, phos, SPEP/FLC -DEXA due 03/2026   -Calcium intake of 1200 mg daily between diet and supplements -Vitamin D intake of 1000-2000 IU per day.  Target vitamin D level >30.   -Counseled to avoid smoking and alcohol -Discussed risks/benefits of treatment, possible side effects including osteonecrosis of the jaw and atypical femur fractures, and alternative treatment options -Discussed regular exercise, particularly strength training    #Unintentional weight loss -20 lb weight loss in last 5 years.  30 pound weight loss in the last 10 years.   -Peak weight 131 pounds in 2015. -BMI intermittently less than 18.5 -On Remeron very briefly to gain weight in 2025 -Suspect large component of anxiety and food insecurity Plan: -Concern for using Remeron off label to gain weight.  This medication has potential side effects of increased bone fractures and anticholinergic effects,  both which are highly concerning in elderly patients/osteoporosis. -Also concern for underlying etiology of unintentional weight loss.  Would favor thorough workup of this over attempting to treat with medication.  Unknown what workup has been done so far.  Would recommend at least making sure patient is up-to-date on all cancer screenings.  Will defer to PCP.   HPI Kirsten Powell is a 63 y.o. female seen for osteoporosis. Their history is also significant for premature ovarian insufficiency, hypertension, hyperlipidemia.   Reports she has stopped taking Remeron  Has not been taking alendronate   Pertinent bone history  - Primary ovarian insufficiency?  Fracture history  -none  Growth/development concerns  -None  -Sisters are similar height but weight noticeably more   Menarche/Menopause  -Menarche around age 8 -Menopause around age unknown. Last period was at age 61. Never worked up  -No HRT  Bone treatment history  -None  Exercise  -Walks a lot  Family history  -Strong family history of kidney and cardiovascular disease   -no known family history of osteoporosis   Calcium supplement / in diet   -Yes -Good dietary calcium   Vitamin D supplement   -Yes    Risk factors for osteoporosis or low bone mass:  -Kidney stones: no -Height loss: no -Exposure to glucocorticoids: no -History of hypercalcemia or other calcium disorders: no -Thyroid disease: no -Hyperparathyroidism: no  -Renal insufficiency/ESRD: no -Smoking history: no -Alcohol history: minimal -Family history of osteoporosis: no -Weight <127 lbs: yes         DEXA T-Scores LS LTH LFN RTH RFN  03/2024 -3.6 -2.0 -2.2           FRAX: 0.7% hip, 4.2% any  Physical Exam: Vitals:   06/01/24 1118  BP: 136/70  Pulse: 70   Physical Exam Constitutional:      General: She is not in acute distress.    Appearance: Normal appearance. She is underweight. She is not ill-appearing.  HENT:     Head:  Normocephalic and atraumatic.  Eyes:     Extraocular Movements: Extraocular movements intact.  Cardiovascular:     Rate and Rhythm: Normal rate.  Pulmonary:     Effort: Pulmonary effort is normal. No respiratory distress.  Musculoskeletal:     Cervical back: No bony tenderness.     Thoracic back: No bony tenderness.     Lumbar back: No bony tenderness.     Right lower leg: No edema.     Left lower leg: No edema.  Skin:    General: Skin is warm and dry.  Neurological:     Mental Status: She is alert and oriented to person, place, and time. Mental status is at baseline.  Psychiatric:        Mood and Affect: Mood is anxious. Affect is tearful.        Behavior: Behavior normal.    ________________________________________________ Medical history: Past Medical History:  Diagnosis Date  . Depression   . Heart murmur   . Hx of bone density study   . Hypertension     Surgical history: No past surgical history on file.  Family history: Family History  Problem Relation Name Age of Onset  . High blood pressure (Hypertension) Mother    . Heart disease Father    . Cancer Father         Bladder  . High blood pressure (Hypertension) Sister    . High blood pressure (Hypertension) Brother    . Prostate cancer Brother      Social History:  Social History   Tobacco Use  . Smoking status: Never  . Smokeless tobacco: Never  Substance Use Topics  . Alcohol use: No    Alcohol/week: 0.0 standard drinks of alcohol    Allergies: Allergies  Allergen Reactions  . Epinephrine Palpitations  . Influenza Vacc,Tri 2005 (Live) Unknown     Medications:  Current Outpatient Medications  Medication Sig Dispense Refill  . rosuvastatin (CRESTOR) 10 MG tablet Take 1 tablet (10 mg total) by mouth once daily 30 tablet 11  . triamterene-hydrochlorothiazide (MAXZIDE-25) 37.5-25 mg tablet     . VITAMIN D3 ORAL Take by mouth    . alendronate (FOSAMAX) 70 MG tablet Take 70 mg by mouth every 7  (seven) days Take on an empty stomach with a full glass of water. Avoid mineral or well water. Do not eat or take other medications for at least 30 minutes after dose. Sit or stand for at least 30 minutes after dose. (Patient not taking: Reported on 06/01/2024)    . mirtazapine (REMERON) 7.5 MG tablet Take 1 tablet (7.5 mg total) by mouth at bedtime (Patient not taking: Reported on 06/01/2024) 30 tablet 11   No current facility-administered medications for this visit.    Review of systems: As per HPI, otherwise negative   Laboratory Lab Results  Component Value Date   CALCIUM 9.4 04/23/2024   CREATININE 0.9 04/23/2024     Lonni Hoover, MD [facility] Endocrinology   Level 5 exam based on time

## 2024-06-22 ENCOUNTER — Inpatient Hospital Stay

## 2024-06-22 ENCOUNTER — Inpatient Hospital Stay: Attending: Oncology | Admitting: Oncology

## 2024-06-22 ENCOUNTER — Encounter: Payer: Self-pay | Admitting: Oncology

## 2024-06-22 VITALS — BP 117/83 | HR 76 | Temp 97.0°F | Resp 18 | Ht 64.0 in | Wt 107.8 lb

## 2024-06-22 DIAGNOSIS — R7689 Other specified abnormal immunological findings in serum: Secondary | ICD-10-CM | POA: Insufficient documentation

## 2024-06-22 NOTE — Progress Notes (Signed)
 New patient, referral for elevated serum immunoglobulin free light.

## 2024-06-22 NOTE — Progress Notes (Signed)
 Hematology/Oncology Consult note [facility] Telephone:([phone] [phone]  Patient Care Team: Lorel Maxie LABOR, MD as PCP - General (Family Medicine) Cindie Jesusa HERO, RN as Registered Nurse Cindie Jesusa HERO, RN as Registered Nurse Cindie Jesusa HERO, RN as Registered Nurse   Name of the patient: Kirsten Powell  [id number]  [birth year]    Reason for referral-elevated free light chain   Referring physician-Dr. Lorel  Date of visit: 06/22/24   History of presenting illness-patient is a 63 year old female with a past medical history significant For hypertension hyperlipidemia and osteoporosis.  She was seen by primary care doctor on 05/16/2024 for concerns for weight loss and inadequate dietary intake she had SPEP on 06/01/2024 which showed mildly elevated free kappa light chain of 28.9 with a normal free light chain ratio.  No evidence of M protein noted on SPEP.  CMP was within normal limits with a normal total protein of 7.2 and calcium of 9.4.  CBC shows a normal white count of 4.5, H&H of 12/36.1 and a platelet count of 188.   She has been experiencing weight loss, which she attributes to stress and financial difficulties, leading to reduced food intake. She consumes approximately one and a half meals a day due to high rent costs and is living with her ex-husband to manage expenses. She is concerned about her weight loss, noting that she has never been this small except during childhood. She was previously 128 pounds and has been monitoring her weight weekly, observing a consistent decrease.   ECOG PS- 1  Pain scale- 0   Review of systems- Review of Systems  Constitutional:  Positive for malaise/fatigue and weight loss. Negative for chills and fever.  HENT:  Negative for congestion, ear discharge and nosebleeds.   Eyes:  Negative for blurred vision.  Respiratory:  Negative for cough, hemoptysis, sputum production, shortness of breath and wheezing.    Cardiovascular:  Negative for chest pain, palpitations, orthopnea and claudication.  Gastrointestinal:  Negative for abdominal pain, blood in stool, constipation, diarrhea, heartburn, melena, nausea and vomiting.  Genitourinary:  Negative for dysuria, flank pain, frequency, hematuria and urgency.  Musculoskeletal:  Negative for back pain, joint pain and myalgias.  Skin:  Negative for rash.  Neurological:  Negative for dizziness, tingling, focal weakness, seizures, weakness and headaches.  Endo/Heme/Allergies:  Does not bruise/bleed easily.  Psychiatric/Behavioral:  Negative for depression and suicidal ideas. The patient does not have insomnia.     Allergies  Allergen Reactions   Epinephrine Palpitations    There are no active problems to display for this patient.    Past Medical History:  Diagnosis Date   Hypertension      History reviewed. No pertinent surgical history.  Social History   Socioeconomic History   Marital status: Married    Spouse name: Not on file   Number of children: Not on file   Years of education: Not on file   Highest education level: Not on file  Occupational History   Not on file  Tobacco Use   Smoking status: Never   Smokeless tobacco: Never  Vaping Use   Vaping status: Never Used  Substance and Sexual Activity   Alcohol use: Yes   Drug use: Not Currently   Sexual activity: Not on file  Other Topics Concern   Not on file  Social History Narrative   Not on file   Social Drivers of Health   Financial Resource Strain: Low Risk  (05/16/2024)  Received from [facility]   Overall Financial Resource Strain (CARDIA)    Difficulty of Paying Living Expenses: Not hard at all  Recent Concern: Financial Resource Strain - Medium Risk (03/02/2024)   Received from [facility]   Overall Financial Resource Strain (CARDIA)    Difficulty of Paying Living Expenses: Somewhat hard  Food Insecurity: No Food Insecurity  (06/22/2024)   Hunger Vital Sign    Worried About Running Out of Food in the Last Year: Never true    Ran Out of Food in the Last Year: Never true  Transportation Needs: No Transportation Needs (06/22/2024)   PRAPARE - Administrator, Civil Service (Medical): No    Lack of Transportation (Non-Medical): No  Physical Activity: Not on file  Stress: Not on file  Social Connections: Not on file  Intimate Partner Violence: Not At Risk (06/22/2024)   Humiliation, Afraid, Rape, and Kick questionnaire    Fear of Current or Ex-Partner: No    Emotionally Abused: No    Physically Abused: No    Sexually Abused: No     Family History  Problem Relation Age of Onset   Hypertension Mother    Bladder Cancer Father    Breast cancer Neg Hx      Current Outpatient Medications:    rosuvastatin (CRESTOR) 10 MG tablet, Take 10 mg by mouth daily., Disp: , Rfl:    triamterene-hydrochlorothiazide (MAXZIDE-25) 37.5-25 MG tablet, Take 1 tablet by mouth daily., Disp: , Rfl:    amLODipine (NORVASC) 2.5 MG tablet, Take 2.5 mg by mouth daily. (Patient not taking: Reported on 06/22/2024), Disp: , Rfl:    Physical exam:  Vitals:   06/22/24 1056  BP: 117/83  Pulse: 76  Resp: 18  Temp: (!) 97 F (36.1 C)  TempSrc: Tympanic  SpO2: 100%  Weight: 107 lb 12.8 oz (48.9 kg)  Height: 5' 4 (1.626 m)   Physical Exam Cardiovascular:     Rate and Rhythm: Normal rate and regular rhythm.     Heart sounds: Normal heart sounds.  Pulmonary:     Effort: Pulmonary effort is normal.     Breath sounds: Normal breath sounds.  Skin:    General: Skin is warm and dry.  Neurological:     Mental Status: She is alert and oriented to person, place, and time.           Latest Ref Rng & Units 11/01/2023    3:11 PM  CMP  Glucose 70 - 99 mg/dL 888   BUN 8 - 23 mg/dL 13   Creatinine 9.55 - 1.00 mg/dL 9.12   Sodium 864 - 854 mmol/L 137   Potassium 3.5 - 5.1 mmol/L 3.0   Chloride 98 - 111 mmol/L 99   CO2  22 - 32 mmol/L 26   Calcium 8.9 - 10.3 mg/dL 8.7       Latest Ref Rng & Units 11/01/2023    3:11 PM  CBC  WBC 4.0 - 10.5 K/uL 6.2   Hemoglobin 12.0 - 15.0 g/dL 87.5   Hematocrit 63.9 - 46.0 % 37.1   Platelets 150 - 400 K/uL 201     Assessment and plan- Patient is a 63 y.o. female referred for elevated free light chain   Evaluation for hematologic malignancy (multiple myeloma) Slightly elevated kappa light chain with normal kappa/lambda ratio. Normal blood parameters. Negative myeloma panel. No evidence of hematologic malignancy. - Repeat blood tests in six months to reassess light chain  levels and other parameters.suspicion for plasma cell dyscrasia is low   Thank you for this kind referral and the opportunity to participate in the care of this patient   Visit Diagnosis 1. Elevated serum immunoglobulin free light chains     Dr. Annah Skene, MD, MPH [facility] at [facility] [phone] 06/22/2024

## 2024-09-26 ENCOUNTER — Telehealth: Payer: Self-pay | Admitting: Oncology

## 2024-09-26 ENCOUNTER — Encounter: Payer: Self-pay | Admitting: Oncology

## 2024-09-26 NOTE — Telephone Encounter (Signed)
 Patient left a voicemail requesting appointment dates. Called patient- voicemail full not able to leave a message- Mailed appointments to address on file.

## 2024-09-26 NOTE — Telephone Encounter (Signed)
 Pt called and seemed a bit confused - said she started a new work schedule and can't have appts on Thursdays or Fridays - appts all need to be around 1 from now on - r/s appts w/pt - [facility]

## 2024-10-01 ENCOUNTER — Telehealth: Payer: Self-pay | Admitting: *Deleted

## 2024-10-01 NOTE — Telephone Encounter (Signed)
 Caller verified using pt's full name and dob prior to discussing PHI   Patient contacted the clinic requesting a letter stating her diagnosis and clinical findings at the last visit. I have no idea what is even wrong with me. My primary care always sends a letter and you didn't send a letter. I need to know if my kidneys are ok with me getting fosamax and my blood pressure meds. I need my medical diagnosis. I don't know if what you did interfered with my blood pressure meds and regimen. I am currently in the cancer center parking lot and heading in to get my records. Patient became very emotional crying hysterically on the phone. She proceeded to share that she wants the last office note. I can't even remember my password to get into mychart or the email that I use. I informed her that she would need to sign a release of information to get her printed records. Discussed that we can print her last AVS from the visit in October. I explained that Dr. Melanee did not draw any labs in October 2025. She then proceeded to shared, I have uncertainty in what is wrong with me. I have many family members that also come to the cancer center and I don't know what I have. I have no idea what is going on with me. I just need to tell me what is going on. I need my last office note. My insurance is running out and I am quitting my job. I need the labs apts now instead of waiting months from now. Patient instructed that she would need to complete a release of medical records. I explained to her that Dr. Melanee recommended labs in March and she has an apt to see Dr. Melanee in April to review the results. Patient repeatedly kept sharing she needs to know what is wrong with me. You didn't send me a letter. I attempted to share with the patient that the recommendations for the hydrochlorothiazide/Triamterene and fosamax should be discussed with her pcp. Multiple attempts to redirect patient were made. Pt stated that she was there in the  clinic now and she would discuss this at the front desk. Call ended.  I message Dr. Darold team and the front desk and HIM in cancer center to let them know pt was in the clinic requesting records and AVS.

## 2024-10-01 NOTE — Telephone Encounter (Signed)
 Per Lyle, she was able to assist the patient to reseting her mychart so that pt could view the AVS/records.

## 2024-10-09 ENCOUNTER — Telehealth: Payer: Self-pay | Admitting: *Deleted

## 2024-10-09 NOTE — Telephone Encounter (Signed)
 Patient left vm on triage to request clinic to change her pcp to Dr. Salli in [place]. I have updated this on her chart per her request.

## 2024-12-11 ENCOUNTER — Inpatient Hospital Stay

## 2024-12-12 ENCOUNTER — Inpatient Hospital Stay

## 2024-12-21 ENCOUNTER — Other Ambulatory Visit

## 2024-12-26 ENCOUNTER — Inpatient Hospital Stay: Admitting: Oncology

## 2025-01-04 ENCOUNTER — Ambulatory Visit: Admitting: Oncology
